# Patient Record
Sex: Male | Born: 1990 | Race: White | Hispanic: No | Marital: Single | Smoking: Never smoker
Health system: Southern US, Community
[De-identification: ages and names within clinical notes are randomized; demographics above are authoritative.]

## PROBLEM LIST (undated history)

## (undated) DIAGNOSIS — F419 Anxiety disorder, unspecified: Secondary | ICD-10-CM

## (undated) HISTORY — DX: Anxiety disorder, unspecified: F41.9

---
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
## 2015-04-25 ENCOUNTER — Other Ambulatory Visit: Payer: Self-pay | Admitting: Family Medicine

## 2015-04-26 LAB — CMP12+LP+TP+TSH+6AC+CBC/D/PLT
A/G RATIO: 2.3 (ref 1.1–2.5)
ALBUMIN: 4.6 g/dL (ref 3.5–5.5)
ALK PHOS: 46 IU/L (ref 39–117)
ALT: 10 IU/L (ref 0–44)
AST: 14 IU/L (ref 0–40)
BASOS: 1 %
BILIRUBIN TOTAL: 0.8 mg/dL (ref 0.0–1.2)
BUN / CREAT RATIO: 12 (ref 8–19)
BUN: 12 mg/dL (ref 6–20)
Basophils Absolute: 0 10*3/uL (ref 0.0–0.2)
CHOLESTEROL TOTAL: 133 mg/dL (ref 100–199)
Calcium: 9 mg/dL (ref 8.7–10.2)
Chloride: 102 mmol/L (ref 96–106)
Chol/HDL Ratio: 2.9 ratio units (ref 0.0–5.0)
Creatinine, Ser: 1.02 mg/dL (ref 0.76–1.27)
EOS (ABSOLUTE): 0.1 10*3/uL (ref 0.0–0.4)
EOS: 2 %
Estimated CHD Risk: <0.5 times avg. (ref 0.0–1.0)
FREE THYROXINE INDEX: 2.7 (ref 1.2–4.9)
GFR calc Af Amer: 119 mL/min/{1.73_m2} (ref >59)
GFR, EST NON AFRICAN AMERICAN: 103 mL/min/{1.73_m2} (ref >59)
GGT: 9 IU/L (ref 0–65)
GLOBULIN, TOTAL: 2 g/dL (ref 1.5–4.5)
Glucose: 90 mg/dL (ref 65–99)
HDL: 46 mg/dL (ref >39)
HEMATOCRIT: 44.5 % (ref 37.5–51.0)
HEMOGLOBIN: 15.1 g/dL (ref 12.6–17.7)
IMMATURE GRANULOCYTES: 0 %
Immature Grans (Abs): 0 10*3/uL (ref 0.0–0.1)
Iron: 131 ug/dL (ref 38–169)
LDH: 121 IU/L (ref 121–224)
LDL CALC: 73 mg/dL (ref 0–99)
LYMPHS ABS: 1.5 10*3/uL (ref 0.7–3.1)
Lymphs: 34 %
MCH: 30.2 pg (ref 26.6–33.0)
MCHC: 33.9 g/dL (ref 31.5–35.7)
MCV: 89 fL (ref 79–97)
MONOS ABS: 0.4 10*3/uL (ref 0.1–0.9)
Monocytes: 8 %
NEUTROS PCT: 55 %
Neutrophils Absolute: 2.5 10*3/uL (ref 1.4–7.0)
POTASSIUM: 4.1 mmol/L (ref 3.5–5.2)
Phosphorus: 3.6 mg/dL (ref 2.5–4.5)
Platelets: 220 10*3/uL (ref 150–379)
RBC: 5 x10E6/uL (ref 4.14–5.80)
RDW: 13.2 % (ref 12.3–15.4)
SODIUM: 141 mmol/L (ref 134–144)
T3 Uptake Ratio: 28 % (ref 24–39)
T4, Total: 9.5 ug/dL (ref 4.5–12.0)
TRIGLYCERIDES: 71 mg/dL (ref 0–149)
TSH: 1.32 u[IU]/mL (ref 0.450–4.500)
Total Protein: 6.6 g/dL (ref 6.0–8.5)
Uric Acid: 6.2 mg/dL (ref 3.7–8.6)
VLDL CHOLESTEROL CAL: 14 mg/dL (ref 5–40)
WBC: 4.5 10*3/uL (ref 3.4–10.8)

## 2015-04-26 LAB — HEPATITIS B SURFACE ANTIBODY,QUALITATIVE: HEP B SURFACE AB, QUAL: NONREACTIVE

## 2016-09-16 ENCOUNTER — Ambulatory Visit: Payer: Self-pay | Admitting: Physician Assistant

## 2016-09-16 ENCOUNTER — Encounter: Payer: Self-pay | Admitting: Physician Assistant

## 2016-09-16 VITALS — BP 120/60 | HR 86 | Temp 98.5°F | Resp 16 | Ht 76.0 in | Wt 216.0 lb

## 2016-09-16 DIAGNOSIS — Z0000 Encounter for general adult medical examination without abnormal findings: Secondary | ICD-10-CM

## 2016-09-16 NOTE — Progress Notes (Signed)
S: pt here for wellness physical had  biometrics for insurance purposes done at work, no complaints ros neg. PMH: anxiety   Social: occ etoh, +smokeless tobacco Fam: neg  O: vitals wnl, nad, ENT wnl, neck supple no lymph, lungs c t a, cv rrr, abd soft nontender bs normal all 4 quads  A: wellness physical  P: f/u in december

## 2017-10-29 ENCOUNTER — Emergency Department
Admission: EM | Admit: 2017-10-29 | Discharge: 2017-10-29 | Disposition: A | Discharge status: Home / Self Care | Payer: Managed Care, Other (non HMO) | Attending: Emergency Medicine | Admitting: Emergency Medicine

## 2017-10-29 ENCOUNTER — Emergency Department: Payer: Managed Care, Other (non HMO)

## 2017-10-29 ENCOUNTER — Other Ambulatory Visit: Payer: Self-pay

## 2017-10-29 ENCOUNTER — Encounter: Payer: Self-pay | Admitting: Emergency Medicine

## 2017-10-29 DIAGNOSIS — R509 Fever, unspecified: Secondary | ICD-10-CM | POA: Insufficient documentation

## 2017-10-29 DIAGNOSIS — F419 Anxiety disorder, unspecified: Secondary | ICD-10-CM | POA: Insufficient documentation

## 2017-10-29 DIAGNOSIS — R42 Dizziness and giddiness: Secondary | ICD-10-CM | POA: Diagnosis present

## 2017-10-29 LAB — URINALYSIS, COMPLETE (UACMP) WITH MICROSCOPIC
BACTERIA UA: NONE SEEN
BILIRUBIN URINE: NEGATIVE
Glucose, UA: NEGATIVE mg/dL
Hgb urine dipstick: NEGATIVE
Ketones, ur: 5 mg/dL — AB
Leukocytes, UA: NEGATIVE
Nitrite: NEGATIVE
PROTEIN: NEGATIVE mg/dL
SPECIFIC GRAVITY, URINE: 1.019 (ref 1.005–1.030)
SQUAMOUS EPITHELIAL / LPF: NONE SEEN (ref 0–5)
pH: 8 (ref 5.0–8.0)

## 2017-10-29 LAB — INFLUENZA PANEL BY PCR (TYPE A & B)
Influenza A By PCR: NEGATIVE
Influenza B By PCR: NEGATIVE

## 2017-10-29 LAB — CBC
HEMATOCRIT: 45.5 % (ref 40.0–52.0)
Hemoglobin: 15.9 g/dL (ref 13.0–18.0)
MCH: 30.6 pg (ref 26.0–34.0)
MCHC: 34.9 g/dL (ref 32.0–36.0)
MCV: 87.6 fL (ref 80.0–100.0)
Platelets: 176 10*3/uL (ref 150–440)
RBC: 5.19 MIL/uL (ref 4.40–5.90)
RDW: 13.6 % (ref 11.5–14.5)
WBC: 4.7 10*3/uL (ref 3.8–10.6)

## 2017-10-29 LAB — BASIC METABOLIC PANEL
Anion gap: 7 (ref 5–15)
BUN: 12 mg/dL (ref 6–20)
CHLORIDE: 103 mmol/L (ref 98–111)
CO2: 26 mmol/L (ref 22–32)
Calcium: 9.1 mg/dL (ref 8.9–10.3)
Creatinine, Ser: 1.25 mg/dL — ABNORMAL HIGH (ref 0.61–1.24)
GFR calc Af Amer: >60 mL/min (ref >60)
GFR calc non Af Amer: >60 mL/min (ref >60)
GLUCOSE: 99 mg/dL (ref 70–99)
POTASSIUM: 4.1 mmol/L (ref 3.5–5.1)
Sodium: 136 mmol/L (ref 135–145)

## 2017-10-29 LAB — TROPONIN I

## 2017-10-29 MED ORDER — SODIUM CHLORIDE 0.9 % IV SOLN
Freq: Once | INTRAVENOUS | Status: AC
Start: 2017-10-29 — End: 2017-10-29
  Administered 2017-10-29: 21:00:00 via INTRAVENOUS

## 2017-10-29 MED ORDER — ACETAMINOPHEN 500 MG PO TABS
1000.0000 mg | ORAL_TABLET | Freq: Once | ORAL | Status: AC
  Administered 2017-10-29: 1000 mg via ORAL
  Filled 2017-10-29: qty 2

## 2017-10-29 NOTE — ED Triage Notes (Signed)
Pt c/o dizziness, weakness and fever x2 days. [facility] and sent to ED for further evaluation. Pt denies N/V/D, chest pain and SOB. 100.92F in triage. 106BPM.

## 2017-10-29 NOTE — ED Provider Notes (Signed)
[facility] Emergency Department Provider Note       Time seen: ----------------------------------------- 8:24 PM on 10/29/2017 -----------------------------------------   I have reviewed the triage vital signs and the nursing notes.  HISTORY   Chief Complaint Dizziness; Weakness; and Fever    HPI Tyler Kirby is a 27 y.o. male with a history of anxiety who presents to the ED for dizziness, weakness and fever for the past 2 days.  Patient was seen in urgent care today and sent to the ER for further evaluation.  He denies nausea, vomiting, diarrhea, chest pain or shortness of breath.  He has had some muscle aches.  Temperature was up to 103 at home.  He denies any other complaints or sick contacts.  Past Medical History:  Diagnosis Date  . Anxiety     There are no active problems to display for this patient.   History reviewed. No pertinent surgical history.  Allergies Patient has no known allergies.  Social History Social History   Tobacco Use  . Smoking status: Never Smoker  . Smokeless tobacco: Current User  Substance Use Topics  . Alcohol use: Yes    Comment: occasionally  . Drug use: No   Review of Systems Constitutional: Positive for fever and body aches Eyes: Negative for vision changes ENT:  Negative for congestion, sore throat Cardiovascular: Negative for chest pain. Respiratory: Negative for shortness of breath. Gastrointestinal: Negative for abdominal pain, vomiting and diarrhea. Musculoskeletal: Negative for back pain. Skin: Negative for rash. Neurological: Positive for dizziness and weakness  All systems negative/normal/unremarkable except as stated in the HPI  ____________________________________________   PHYSICAL EXAM:  VITAL SIGNS: ED Triage Vitals [10/29/17 1912]  Enc Vitals Group     BP 103/75     Pulse Rate (!) 106     Resp 18     Temp 100.2 F (37.9 C)     Temp Source Oral     SpO2 100 %     Weight  240 lb (108.9 kg)     Height 6\' 4"  (1.93 m)     Head Circumference      Peak Flow      Pain Score      Pain Loc      Pain Edu?      Excl. in GC?    Constitutional: Alert and oriented. Well appearing and in no distress. Eyes: Conjunctivae are normal. Normal extraocular movements. ENT   Head: Normocephalic and atraumatic.   Nose: No congestion/rhinnorhea.   Mouth/Throat: Mucous membranes are moist.   Neck: No stridor. Cardiovascular: Normal rate, regular rhythm. No murmurs, rubs, or gallops. Respiratory: Normal respiratory effort without tachypnea nor retractions. Breath sounds are clear and equal bilaterally. No wheezes/rales/rhonchi. Gastrointestinal: Soft and nontender. Normal bowel sounds Musculoskeletal: Nontender with normal range of motion in extremities. No lower extremity tenderness nor edema. Neurologic:  Normal speech and language. No gross focal neurologic deficits are appreciated.  Skin: Diaphoresis is noted Psychiatric: Mood and affect are normal. Speech and behavior are normal.  ____________________________________________  EKG: Interpreted by me.  Sinus tachycardia with rate of 118 bpm, normal PR interval, normal QRS, normal QT  ____________________________________________  ED COURSE:  As part of my medical decision making, I reviewed the following data within the electronic MEDICAL RECORD NUMBER History obtained from family if available, nursing notes, old chart and ekg, as well as notes from prior ED visits. Patient presented for flulike symptoms, we will assess with labs and imaging as indicated at  this time.   Procedures ____________________________________________   LABS (pertinent positives/negatives)  Labs Reviewed  BASIC METABOLIC PANEL - Abnormal; Notable for the following components:      Result Value   Creatinine, Ser 1.25 (*)    All other components within normal limits  URINALYSIS, COMPLETE (UACMP) WITH MICROSCOPIC - Abnormal; Notable for  the following components:   Color, Urine YELLOW (*)    APPearance CLEAR (*)    Ketones, ur 5 (*)    All other components within normal limits  CBC  TROPONIN I  INFLUENZA PANEL BY PCR (TYPE A & B)  ROCKY MTN SPOTTED FVR ABS PNL(IGG+IGM)  B. BURGDORFI ANTIBODIES  CBG MONITORING, ED    RADIOLOGY Images were viewed by me  Chest x-ray IMPRESSION: No edema or consolidation. ____________________________________________  DIFFERENTIAL DIAGNOSIS   Dehydration, electrolyte abnormality, viral illness, influenza, tickborne illness  FINAL ASSESSMENT AND PLAN  Fever, likely viral illness   Plan: The patient had presented for fever, dizziness and weakness. Patient's labs were reassuring although we have sent off for some labs related to possible tick exposure. Patient's imaging does not reveal any acute process either.  We will encourage close outpatient follow-up.   Ulice Dash, MD   Note: This note was generated in part or whole with voice recognition software. Voice recognition is usually quite accurate but there are transcription errors that can and very often do occur. I apologize for any typographical errors that were not detected and corrected.     Emily Filbert, MD 10/29/17 2147

## 2017-11-02 LAB — B. BURGDORFI ANTIBODIES: B burgdorferi Ab IgG+IgM: <0.91 {ISR} (ref 0.00–0.90)

## 2017-11-03 LAB — ROCKY MTN SPOTTED FVR ABS PNL(IGG+IGM)
RMSF IGM: 0.49 {index} (ref 0.00–0.89)
RMSF IgG: POSITIVE — AB

## 2017-11-03 LAB — RMSF, IGG, IFA: RMSF, IGG, IFA: 1:128 {titer} — ABNORMAL HIGH

## 2017-11-04 ENCOUNTER — Telehealth: Payer: Self-pay | Admitting: Emergency Medicine

## 2017-11-04 NOTE — Telephone Encounter (Signed)
Called patient to check on condition per dr Mayford Knifewilliams request.  He has reviewed the rmsf/lyme result.  I left message indicating that we would like to make sure he is improving and that he is following up.  I left my number for him to call me.

## 2019-03-10 IMAGING — CR DG CHEST 2V
1 series · 2 of 2 positions shown · non-contrast
Comparison: None.

CLINICAL DATA: Fever

EXAM:
CHEST - 2 VIEW

[Series 1: w chest pa · 0.14mm/px · 2 of 2 slices shown]
[im 1/2]
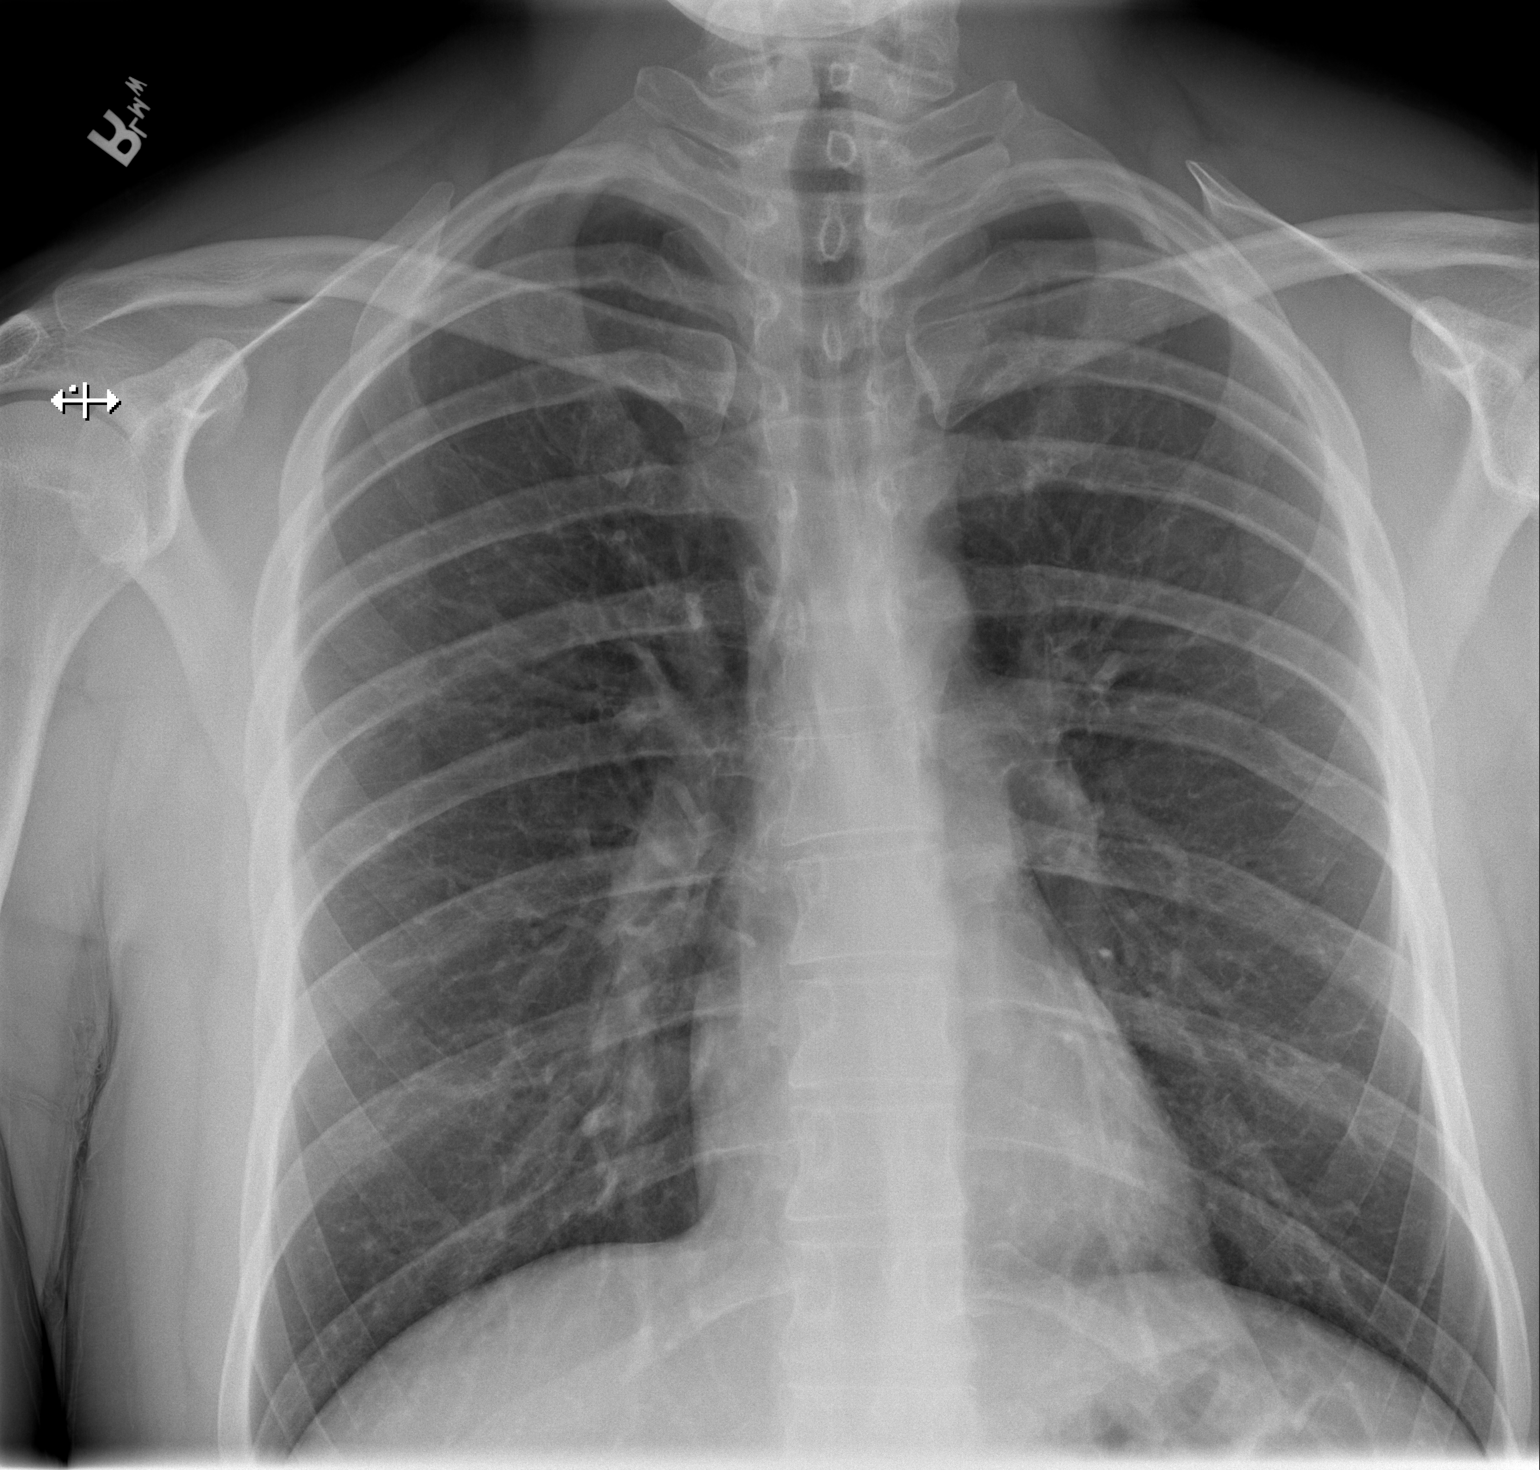
[im 2/2]
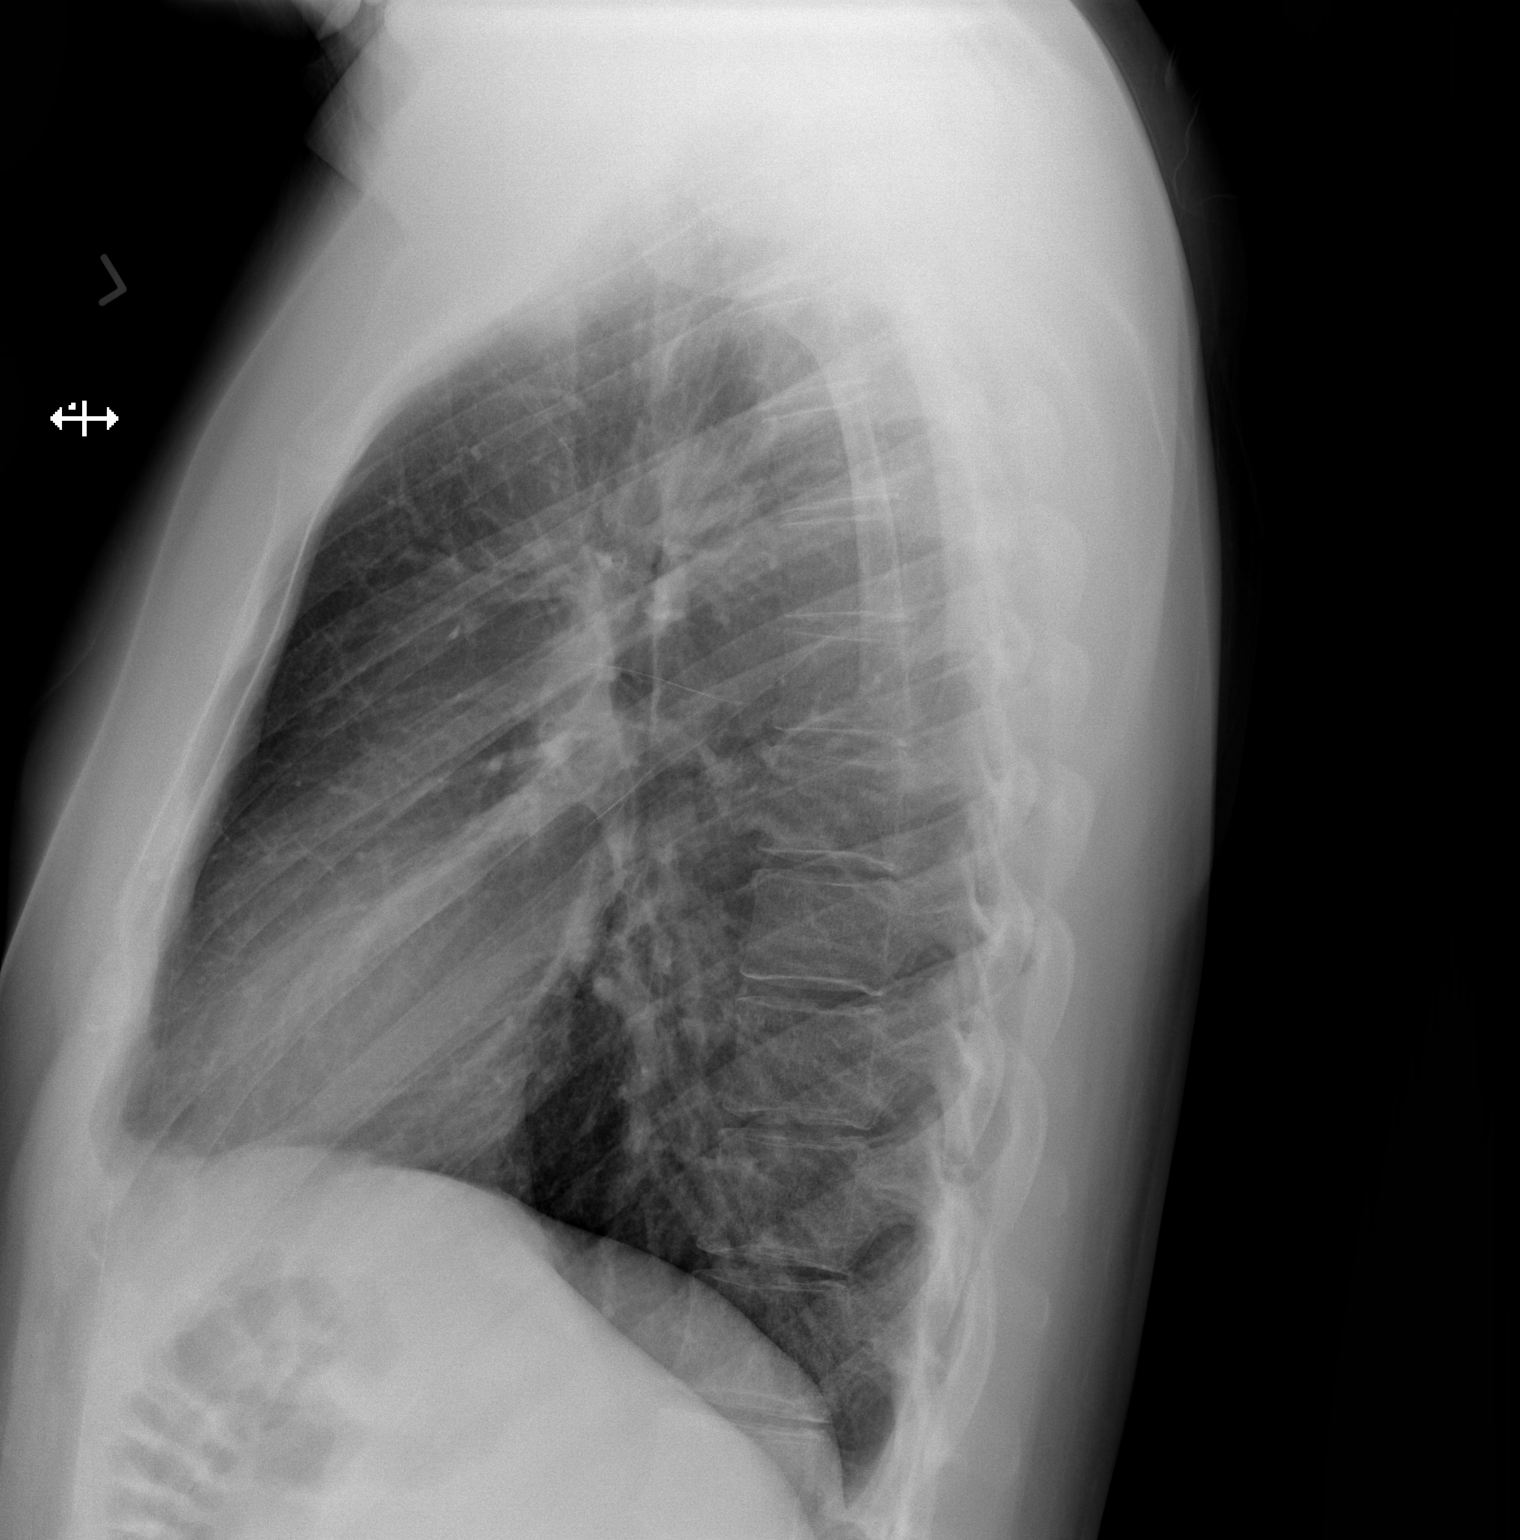

[2 of 2 positions shown; findings below may reference images not displayed]

FINDINGS: Lungs are clear. Heart size and pulmonary vascularity are normal. No
adenopathy. No bone lesions.
IMPRESSION: No edema or consolidation.
